# Patient Record
Sex: Male | Born: 2000 | Race: White | Hispanic: No | Marital: Single
Health system: Southern US, Community
[De-identification: ages and names within clinical notes are randomized; demographics above are authoritative.]

## PROBLEM LIST (undated history)

## (undated) DIAGNOSIS — S62639A Displaced fracture of distal phalanx of unspecified finger, initial encounter for closed fracture: Secondary | ICD-10-CM

## (undated) HISTORY — DX: Displaced fracture of distal phalanx of unspecified finger, initial encounter for closed fracture: S62.639A

---
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
## 2018-06-18 ENCOUNTER — Encounter: Payer: Self-pay | Admitting: Pediatrics

## 2018-06-18 ENCOUNTER — Ambulatory Visit (INDEPENDENT_AMBULATORY_CARE_PROVIDER_SITE_OTHER): Payer: No Typology Code available for payment source | Admitting: Pediatrics

## 2018-06-18 ENCOUNTER — Other Ambulatory Visit: Payer: Self-pay

## 2018-06-18 VITALS — BP 120/74 | Wt 314.1 lb

## 2018-06-18 DIAGNOSIS — H60331 Swimmer's ear, right ear: Secondary | ICD-10-CM | POA: Diagnosis not present

## 2018-06-18 MED ORDER — CIPROFLOXACIN-DEXAMETHASONE 0.3-0.1 % OT SUSP: 4.0000 [drp] | Freq: Two times a day (BID) | OTIC | 0 refills | Status: AC

## 2018-06-18 NOTE — Progress Notes (Signed)
..(  S) 18 y.o. male complains of pain in right ear for 7 days. No fever or URI symptoms. Has been swimming.  (O) He appears well, afebrile. Right ear reveals tenderness of the tragus; debris and inflammation in external canal. TM is not well seen due to debris, but visualized aspects appear normal.  (A) Otitis Externa  (P) Instructed to keep ear dry until better; eardrops per orders with ciprodex, call if persistent pain, swelling or fever, FUV prn.

## 2018-06-18 NOTE — Patient Instructions (Signed)

## 2019-01-14 ENCOUNTER — Ambulatory Visit: Payer: Worker's Compensation | Attending: Internal Medicine

## 2019-01-14 DIAGNOSIS — Z20822 Contact with and (suspected) exposure to covid-19: Secondary | ICD-10-CM

## 2019-01-16 LAB — NOVEL CORONAVIRUS, NAA: SARS-CoV-2, NAA: NOT DETECTED

## 2019-05-18 ENCOUNTER — Ambulatory Visit: Payer: Worker's Compensation | Attending: Internal Medicine

## 2019-05-18 DIAGNOSIS — Z20822 Contact with and (suspected) exposure to covid-19: Secondary | ICD-10-CM

## 2019-05-19 LAB — NOVEL CORONAVIRUS, NAA: SARS-CoV-2, NAA: NOT DETECTED

## 2019-05-19 LAB — SARS-COV-2, NAA 2 DAY TAT

## 2020-07-18 ENCOUNTER — Encounter: Payer: Self-pay | Admitting: Pediatrics

## 2022-09-19 ENCOUNTER — Ambulatory Visit
Admission: EM | Admit: 2022-09-19 | Discharge: 2022-09-19 | Disposition: A | Discharge status: Home / Self Care | Payer: Medicaid Other | Attending: Family Medicine | Admitting: Family Medicine

## 2022-09-19 ENCOUNTER — Encounter: Payer: Self-pay | Admitting: Emergency Medicine

## 2022-09-19 DIAGNOSIS — Z23 Encounter for immunization: Secondary | ICD-10-CM | POA: Diagnosis not present

## 2022-09-19 DIAGNOSIS — S99912A Unspecified injury of left ankle, initial encounter: Secondary | ICD-10-CM | POA: Diagnosis not present

## 2022-09-19 DIAGNOSIS — S91012A Laceration without foreign body, left ankle, initial encounter: Secondary | ICD-10-CM | POA: Diagnosis not present

## 2022-09-19 MED ORDER — TETANUS-DIPHTH-ACELL PERTUSSIS 5-2.5-18.5 LF-MCG/0.5 IM SUSY
0.5000 mL | PREFILLED_SYRINGE | Freq: Once | INTRAMUSCULAR | Status: AC
  Administered 2022-09-19: 0.5 mL via INTRAMUSCULAR

## 2022-09-19 MED ORDER — CEPHALEXIN 500 MG PO CAPS: 500.0000 mg | ORAL_CAPSULE | Freq: Two times a day (BID) | ORAL | 0 refills | Status: AC

## 2022-09-19 NOTE — Discharge Instructions (Addendum)
Advised patient to take medication as directed with food to completion.  Encouraged increase daily water intake to 64 ounces per day while taking this medication.  Advised patient to stay off left ankle is much as possible for the next 2 to 3 days.  Advised patient to keep left ankle laceration covered for the next 36 to 48 hours, afterwards leave open to air to heal by secondary intention/scab formation.  Advised patient to return to clinic in 14 days for suture removal.  Advised if symptoms worsen and/or unresolved please follow-up PCP or here for further evaluation.

## 2022-09-19 NOTE — ED Triage Notes (Signed)
Patient states that he cut his left ankle on a piece of metal about 30 mins ago.  Unsure of last Tdap.

## 2022-09-19 NOTE — ED Provider Notes (Signed)
[facility]    CSN: [id number] Arrival date & time: 09/19/22  1052      History   Chief Complaint Chief Complaint  Patient presents with   Laceration    HPI Jonathan Maldonado is a 22 y.o. male.   HPI 22 year old male presents with a left ankle laceration that occurred at work today roughly 10:40 AM.  Patient reports walking by rack that held sheet metal sticking out.  Patient reports metal sheet sticking out caused left ankle laceration.  PMH significant for morbid obesity.  Past Medical History:  Diagnosis Date   Fracture of distal phalanx of finger 05/03/2017    There are no problems to display for this patient.   History reviewed. No pertinent surgical history.     Home Medications    Prior to Admission medications   Medication Sig Start Date End Date Taking? Authorizing Provider  cephALEXin (KEFLEX) 500 MG capsule Take 1 capsule (500 mg total) by mouth 2 (two) times daily for 7 days. 09/19/22 09/26/22 Yes Trevor Iha, FNP    Family History History reviewed. No pertinent family history.  Social History Social History   Tobacco Use   Smoking status: Passive Smoke Exposure - Never Smoker   Smokeless tobacco: Former   Tobacco comments:    vapes occasionally , used chewing tob  Substance Use Topics   Alcohol use: No   Drug use: No     Allergies   Patient has no known allergies.   Review of Systems Review of Systems  Skin:  Positive for wound.     Physical Exam Triage Vital Signs ED Triage Vitals  Encounter Vitals Group     BP      Systolic BP Percentile      Diastolic BP Percentile      Pulse      Resp      Temp      Temp src      SpO2      Weight      Height      Head Circumference      Peak Flow      Pain Score      Pain Loc      Pain Education      Exclude from Growth Chart    No data found.  Updated Vital Signs BP 128/86 (BP Location: Left Arm)   Pulse 75   Temp 98.6 F (37 C) (Oral)   Ht 6' (1.829 m)   Wt 300  lb (136.1 kg)   SpO2 96%   BMI 40.69 kg/m    Physical Exam Vitals and nursing note reviewed.  Constitutional:      Appearance: Normal appearance. He is obese.  HENT:     Head: Normocephalic and atraumatic.     Mouth/Throat:     Mouth: Mucous membranes are moist.     Pharynx: Oropharynx is clear.  Eyes:     Extraocular Movements: Extraocular movements intact.     Conjunctiva/sclera: Conjunctivae normal.     Pupils: Pupils are equal, round, and reactive to light.  Cardiovascular:     Rate and Rhythm: Normal rate and regular rhythm.     Pulses: Normal pulses.     Heart sounds: Normal heart sounds.  Pulmonary:     Effort: Pulmonary effort is normal.     Breath sounds: Normal breath sounds. No wheezing, rhonchi or rales.  Musculoskeletal:        General: Normal range of motion.  Cervical back: Normal range of motion and neck supple.  Skin:    General: Skin is warm and dry.     Comments: Left ankle: 4.0 cm x 1.0 cm x 3 mm bleeding laceration-please see images below  Neurological:     General: No focal deficit present.     Mental Status: He is alert and oriented to person, place, and time. Mental status is at baseline.  Psychiatric:        Mood and Affect: Mood normal.        Behavior: Behavior normal.        Thought Content: Thought content normal.         UC Treatments / Results  Labs (all labs ordered are listed, but only abnormal results are displayed) Labs Reviewed - No data to display  EKG   Radiology No results found.  Procedures Laceration Repair  Date/Time: 09/19/2022 12:42 PM  Performed by: Trevor Iha, FNP Authorized by: Trevor Iha, FNP   Consent:    Consent obtained:  Verbal   Consent given by:  Patient   Risks discussed:  Infection, need for additional repair, pain, poor cosmetic result and poor wound healing   Alternatives discussed:  No treatment and delayed treatment Universal protocol:    Procedure explained and questions  answered to patient or proxy's satisfaction: yes     Relevant documents present and verified: yes     Test results available: yes     Imaging studies available: yes     Required blood products, implants, devices, and special equipment available: yes     Site/side marked: yes     Immediately prior to procedure, a time out was called: yes     Patient identity confirmed:  Verbally with patient Anesthesia:    Anesthesia method:  Local infiltration   Local anesthetic:  Lidocaine 2% w/o epi Laceration details:    Location: Left ankle.   Length (cm):  4   Depth (mm):  3 Pre-procedure details:    Preparation:  Patient was prepped and draped in usual sterile fashion Exploration:    Wound exploration: wound explored through full range of motion   Treatment:    Area cleansed with:  Shur-Clens, chlorhexidine and saline   Amount of cleaning:  Standard   Irrigation solution:  Sterile saline   Debridement:  None   Undermining:  None   Scar revision: no   Skin repair:    Repair method:  Sutures   Suture size:  4-0   Suture material:  Prolene   Suture technique:  Simple interrupted   Number of sutures:  8 Approximation:    Approximation:  Close Repair type:    Repair type:  Simple Post-procedure details:    Dressing:  Antibiotic ointment, non-adherent dressing and adhesive bandage (With Ace wrap to immobilize ankle for the next 48 hours)   Procedure completion:  Tolerated well, no immediate complications Comments:     Specific instructions provided to patient regarding wound care prior to discharge.  (including critical care time)  Medications Ordered in UC Medications  Tdap (BOOSTRIX) injection 0.5 mL (0.5 mLs Intramuscular Given 09/19/22 1200)    Initial Impression / Assessment and Plan / UC Course  I have reviewed the triage vital signs and the nursing notes.  Pertinent labs & imaging results that were available during my care of the patient were reviewed by me and considered in  my medical decision making (see chart for details).     MDM: 1.  Laceration of left ankle, initial encounter-please see lack repair note attached, Tdap provided in clinic and prior to discharge; 2.  Injury of left ankle, initial encounter-suture repair without complication.  Patient given specific instructions on suture repair prior to discharge, Rx'd Keflex 500 mg capsule twice daily x 7 days. Advised patient to take medication as directed with food to completion.  Encouraged increase daily water intake to 64 ounces per day while taking this medication.  Advised patient to stay off left ankle is much as possible for the next 2 to 3 days.  Advised patient to keep left ankle laceration covered for the next 36 to 48 hours, afterwards leave open to air to heal by secondary intention/scab formation.  Advised patient to return to clinic in 14 days for suture removal.  Advised if symptoms worsen and/or unresolved please follow-up PCP or here for further evaluation.  Patient discharged home, hemodynamically stable.  Work note provided to patient prior to discharge.  Final Clinical Impressions(s) / UC Diagnoses   Final diagnoses:  Laceration of left ankle, initial encounter  Injury of left ankle, initial encounter     Discharge Instructions      Advised patient to take medication as directed with food to completion.  Encouraged increase daily water intake to 64 ounces per day while taking this medication.  Advised patient to stay off left ankle is much as possible for the next 2 to 3 days.  Advised patient to keep left ankle laceration covered for the next 36 to 48 hours, afterwards leave open to air to heal by secondary intention/scab formation.  Advised patient to return to clinic in 14 days for suture removal.  Advised if symptoms worsen and/or unresolved please follow-up PCP or here for further evaluation.     ED Prescriptions     Medication Sig Dispense Auth. Provider   cephALEXin (KEFLEX) 500  MG capsule Take 1 capsule (500 mg total) by mouth 2 (two) times daily for 7 days. 14 capsule Trevor Iha, FNP      PDMP not reviewed this encounter.   Trevor Iha, FNP 09/19/22 1245

## 2023-01-22 DIAGNOSIS — Z6841 Body Mass Index (BMI) 40.0 and over, adult: Secondary | ICD-10-CM | POA: Diagnosis not present

## 2023-01-22 DIAGNOSIS — R030 Elevated blood-pressure reading, without diagnosis of hypertension: Secondary | ICD-10-CM | POA: Diagnosis not present

## 2023-01-22 DIAGNOSIS — D559 Anemia due to enzyme disorder, unspecified: Secondary | ICD-10-CM | POA: Diagnosis not present

## 2023-01-22 DIAGNOSIS — R0683 Snoring: Secondary | ICD-10-CM | POA: Diagnosis not present

## 2023-01-22 DIAGNOSIS — R5383 Other fatigue: Secondary | ICD-10-CM | POA: Diagnosis not present

## 2023-03-19 DIAGNOSIS — D72829 Elevated white blood cell count, unspecified: Secondary | ICD-10-CM | POA: Diagnosis not present

## 2023-03-19 DIAGNOSIS — R748 Abnormal levels of other serum enzymes: Secondary | ICD-10-CM | POA: Diagnosis not present

## 2023-03-19 DIAGNOSIS — R5383 Other fatigue: Secondary | ICD-10-CM | POA: Diagnosis not present

## 2023-07-02 DIAGNOSIS — S82831A Other fracture of upper and lower end of right fibula, initial encounter for closed fracture: Secondary | ICD-10-CM | POA: Diagnosis not present

## 2023-07-02 DIAGNOSIS — S93431A Sprain of tibiofibular ligament of right ankle, initial encounter: Secondary | ICD-10-CM | POA: Diagnosis not present

## 2023-07-02 DIAGNOSIS — M25571 Pain in right ankle and joints of right foot: Secondary | ICD-10-CM | POA: Diagnosis not present

## 2023-07-03 DIAGNOSIS — S8261XA Displaced fracture of lateral malleolus of right fibula, initial encounter for closed fracture: Secondary | ICD-10-CM | POA: Diagnosis not present

## 2023-07-03 DIAGNOSIS — S93491A Sprain of other ligament of right ankle, initial encounter: Secondary | ICD-10-CM | POA: Diagnosis not present

## 2023-07-03 DIAGNOSIS — F17200 Nicotine dependence, unspecified, uncomplicated: Secondary | ICD-10-CM | POA: Diagnosis not present

## 2023-07-09 ENCOUNTER — Ambulatory Visit: Admitting: Orthopedic Surgery

## 2023-07-09 DIAGNOSIS — S93421A Sprain of deltoid ligament of right ankle, initial encounter: Secondary | ICD-10-CM | POA: Diagnosis not present

## 2023-07-09 DIAGNOSIS — S8261XA Displaced fracture of lateral malleolus of right fibula, initial encounter for closed fracture: Secondary | ICD-10-CM | POA: Diagnosis not present

## 2023-07-09 DIAGNOSIS — S93431A Sprain of tibiofibular ligament of right ankle, initial encounter: Secondary | ICD-10-CM | POA: Diagnosis not present

## 2023-07-09 DIAGNOSIS — G8918 Other acute postprocedural pain: Secondary | ICD-10-CM | POA: Diagnosis not present

## 2023-08-26 DIAGNOSIS — Z4789 Encounter for other orthopedic aftercare: Secondary | ICD-10-CM | POA: Diagnosis not present

## 2023-08-26 DIAGNOSIS — M79671 Pain in right foot: Secondary | ICD-10-CM | POA: Diagnosis not present

## 2023-09-13 DIAGNOSIS — M25671 Stiffness of right ankle, not elsewhere classified: Secondary | ICD-10-CM | POA: Diagnosis not present

## 2023-09-18 DIAGNOSIS — M25671 Stiffness of right ankle, not elsewhere classified: Secondary | ICD-10-CM | POA: Diagnosis not present

## 2023-09-23 DIAGNOSIS — S8261XA Displaced fracture of lateral malleolus of right fibula, initial encounter for closed fracture: Secondary | ICD-10-CM | POA: Diagnosis not present

## 2023-09-23 DIAGNOSIS — S8261XD Displaced fracture of lateral malleolus of right fibula, subsequent encounter for closed fracture with routine healing: Secondary | ICD-10-CM | POA: Diagnosis not present

## 2023-09-25 DIAGNOSIS — M25671 Stiffness of right ankle, not elsewhere classified: Secondary | ICD-10-CM | POA: Diagnosis not present

## 2023-10-02 DIAGNOSIS — M25671 Stiffness of right ankle, not elsewhere classified: Secondary | ICD-10-CM | POA: Diagnosis not present

## 2023-10-23 DIAGNOSIS — S8261XD Displaced fracture of lateral malleolus of right fibula, subsequent encounter for closed fracture with routine healing: Secondary | ICD-10-CM | POA: Diagnosis not present

## 2023-11-11 ENCOUNTER — Encounter: Payer: Self-pay | Admitting: Radiology
